# Patient Record
Sex: Female | Born: 1965 | Race: White | Hispanic: No | Marital: Married | Smoking: Current some day smoker
Health system: Southern US, Community
[De-identification: ages and names within clinical notes are randomized; demographics above are authoritative.]

## PROBLEM LIST (undated history)

## (undated) DIAGNOSIS — I10 Essential (primary) hypertension: Secondary | ICD-10-CM

## (undated) DIAGNOSIS — E7800 Pure hypercholesterolemia, unspecified: Secondary | ICD-10-CM

## (undated) DIAGNOSIS — E079 Disorder of thyroid, unspecified: Secondary | ICD-10-CM

## (undated) HISTORY — PX: CHOLECYSTECTOMY: SHX55

## (undated) HISTORY — PX: LAPAROSCOPIC GASTRIC BANDING: SHX1100

---

## 1998-09-02 ENCOUNTER — Inpatient Hospital Stay (HOSPITAL_COMMUNITY): Admission: AD | Admit: 1998-09-02 | Discharge: 1998-09-05 | Payer: Self-pay | Admitting: Gynecology

## 1998-09-05 ENCOUNTER — Encounter (HOSPITAL_COMMUNITY): Admission: RE | Admit: 1998-09-05 | Discharge: 1998-12-04 | Payer: Self-pay | Admitting: Gynecology

## 1998-10-01 ENCOUNTER — Emergency Department (HOSPITAL_COMMUNITY): Admission: EM | Admit: 1998-10-01 | Discharge: 1998-10-01 | Payer: Self-pay | Admitting: Emergency Medicine

## 1998-10-01 ENCOUNTER — Encounter: Payer: Self-pay | Admitting: Emergency Medicine

## 1998-12-06 ENCOUNTER — Inpatient Hospital Stay (HOSPITAL_COMMUNITY): Admission: EM | Admit: 1998-12-06 | Discharge: 1998-12-11 | Payer: Self-pay | Admitting: Emergency Medicine

## 1998-12-06 ENCOUNTER — Encounter: Payer: Self-pay | Admitting: Otolaryngology

## 1998-12-06 ENCOUNTER — Encounter: Payer: Self-pay | Admitting: Emergency Medicine

## 1998-12-07 ENCOUNTER — Encounter: Payer: Self-pay | Admitting: Surgery

## 2000-03-23 ENCOUNTER — Emergency Department (HOSPITAL_COMMUNITY): Admission: EM | Admit: 2000-03-23 | Discharge: 2000-03-23 | Payer: Self-pay | Admitting: Emergency Medicine

## 2000-03-23 ENCOUNTER — Encounter: Payer: Self-pay | Admitting: Emergency Medicine

## 2000-04-22 ENCOUNTER — Encounter: Payer: Self-pay | Admitting: Family Medicine

## 2000-04-22 ENCOUNTER — Encounter: Admission: RE | Admit: 2000-04-22 | Discharge: 2000-04-22 | Payer: Self-pay | Admitting: Family Medicine

## 2012-04-04 ENCOUNTER — Ambulatory Visit: Payer: Self-pay | Admitting: *Deleted

## 2015-09-10 ENCOUNTER — Other Ambulatory Visit: Payer: Self-pay | Admitting: *Deleted

## 2015-09-10 DIAGNOSIS — Z1231 Encounter for screening mammogram for malignant neoplasm of breast: Secondary | ICD-10-CM

## 2015-10-09 ENCOUNTER — Ambulatory Visit: Payer: Self-pay

## 2015-10-20 ENCOUNTER — Ambulatory Visit
Admission: RE | Admit: 2015-10-20 | Discharge: 2015-10-20 | Disposition: A | Discharge status: Home / Self Care | Payer: BLUE CROSS/BLUE SHIELD | Source: Ambulatory Visit | Attending: *Deleted | Admitting: *Deleted

## 2015-10-20 DIAGNOSIS — Z1231 Encounter for screening mammogram for malignant neoplasm of breast: Secondary | ICD-10-CM

## 2020-04-03 ENCOUNTER — Emergency Department (HOSPITAL_COMMUNITY)
Admission: EM | Admit: 2020-04-03 | Discharge: 2020-04-03 | Disposition: A | Discharge status: Home / Self Care | Payer: BLUE CROSS/BLUE SHIELD | Attending: Emergency Medicine | Admitting: Emergency Medicine

## 2020-04-03 ENCOUNTER — Other Ambulatory Visit: Payer: Self-pay

## 2020-04-03 ENCOUNTER — Emergency Department (HOSPITAL_COMMUNITY): Payer: BLUE CROSS/BLUE SHIELD

## 2020-04-03 ENCOUNTER — Encounter (HOSPITAL_COMMUNITY): Payer: Self-pay

## 2020-04-03 DIAGNOSIS — R110 Nausea: Secondary | ICD-10-CM | POA: Diagnosis not present

## 2020-04-03 DIAGNOSIS — Z79899 Other long term (current) drug therapy: Secondary | ICD-10-CM | POA: Diagnosis not present

## 2020-04-03 DIAGNOSIS — R1900 Intra-abdominal and pelvic swelling, mass and lump, unspecified site: Secondary | ICD-10-CM

## 2020-04-03 DIAGNOSIS — R1031 Right lower quadrant pain: Secondary | ICD-10-CM | POA: Diagnosis present

## 2020-04-03 DIAGNOSIS — F17210 Nicotine dependence, cigarettes, uncomplicated: Secondary | ICD-10-CM | POA: Insufficient documentation

## 2020-04-03 DIAGNOSIS — I10 Essential (primary) hypertension: Secondary | ICD-10-CM | POA: Insufficient documentation

## 2020-04-03 DIAGNOSIS — R1909 Other intra-abdominal and pelvic swelling, mass and lump: Secondary | ICD-10-CM | POA: Diagnosis not present

## 2020-04-03 HISTORY — DX: Disorder of thyroid, unspecified: E07.9

## 2020-04-03 HISTORY — DX: Pure hypercholesterolemia, unspecified: E78.00

## 2020-04-03 HISTORY — DX: Essential (primary) hypertension: I10

## 2020-04-03 LAB — COMPREHENSIVE METABOLIC PANEL
ALT: 17 U/L (ref 0–44)
AST: 20 U/L (ref 15–41)
Albumin: 4 g/dL (ref 3.5–5.0)
Alkaline Phosphatase: 54 U/L (ref 38–126)
Anion gap: 11 (ref 5–15)
BUN: 12 mg/dL (ref 6–20)
CO2: 28 mmol/L (ref 22–32)
Calcium: 8.8 mg/dL — ABNORMAL LOW (ref 8.9–10.3)
Chloride: 96 mmol/L — ABNORMAL LOW (ref 98–111)
Creatinine, Ser: 0.67 mg/dL (ref 0.44–1.00)
GFR, Estimated: >60 mL/min (ref >60)
Glucose, Bld: 123 mg/dL — ABNORMAL HIGH (ref 70–99)
Potassium: 3.7 mmol/L (ref 3.5–5.1)
Sodium: 135 mmol/L (ref 135–145)
Total Bilirubin: 1.4 mg/dL — ABNORMAL HIGH (ref 0.3–1.2)
Total Protein: 7.6 g/dL (ref 6.5–8.1)

## 2020-04-03 LAB — CBC
HCT: 42 % (ref 36.0–46.0)
Hemoglobin: 13.5 g/dL (ref 12.0–15.0)
MCH: 29.2 pg (ref 26.0–34.0)
MCHC: 32.1 g/dL (ref 30.0–36.0)
MCV: 90.9 fL (ref 80.0–100.0)
Platelets: 341 10*3/uL (ref 150–400)
RBC: 4.62 MIL/uL (ref 3.87–5.11)
RDW: 13.2 % (ref 11.5–15.5)
WBC: 13.8 10*3/uL — ABNORMAL HIGH (ref 4.0–10.5)
nRBC: 0 % (ref 0.0–0.2)

## 2020-04-03 LAB — URINALYSIS, ROUTINE W REFLEX MICROSCOPIC
Bilirubin Urine: NEGATIVE
Glucose, UA: NEGATIVE mg/dL
Ketones, ur: NEGATIVE mg/dL
Nitrite: NEGATIVE
Protein, ur: NEGATIVE mg/dL
Specific Gravity, Urine: 1.04 — ABNORMAL HIGH (ref 1.005–1.030)
pH: 5 (ref 5.0–8.0)

## 2020-04-03 LAB — I-STAT BETA HCG BLOOD, ED (MC, WL, AP ONLY): I-stat hCG, quantitative: 7.6 m[IU]/mL — ABNORMAL HIGH (ref <5)

## 2020-04-03 LAB — LIPASE, BLOOD: Lipase: 21 U/L (ref 11–51)

## 2020-04-03 MED ORDER — ONDANSETRON 4 MG PO TBDP: 4.0000 mg | ORAL_TABLET | Freq: Once | ORAL | Status: DC

## 2020-04-03 MED ORDER — IOHEXOL 300 MG/ML  SOLN
100.0000 mL | Freq: Once | INTRAMUSCULAR | Status: AC | PRN
  Administered 2020-04-03: 100 mL via INTRAVENOUS

## 2020-04-03 MED ORDER — MORPHINE SULFATE (PF) 4 MG/ML IV SOLN
4.0000 mg | Freq: Once | INTRAVENOUS | Status: AC
  Administered 2020-04-03: 4 mg via INTRAVENOUS
  Filled 2020-04-03: qty 1

## 2020-04-03 MED ORDER — SODIUM CHLORIDE 0.9 % IV BOLUS
1000.0000 mL | Freq: Once | INTRAVENOUS | Status: AC
Start: 2020-04-03 — End: 2020-04-03
  Administered 2020-04-03: 1000 mL via INTRAVENOUS

## 2020-04-03 MED ORDER — OXYCODONE-ACETAMINOPHEN 5-325 MG PO TABS: 1.0000 | ORAL_TABLET | Freq: Three times a day (TID) | ORAL | 0 refills | Status: AC | PRN

## 2020-04-03 MED ORDER — METOCLOPRAMIDE HCL 10 MG PO TABS
5.0000 mg | ORAL_TABLET | Freq: Once | ORAL | Status: AC
  Administered 2020-04-03: 5 mg via ORAL
  Filled 2020-04-03: qty 1

## 2020-04-03 MED ORDER — ONDANSETRON 4 MG PO TBDP: 4.0000 mg | ORAL_TABLET | Freq: Three times a day (TID) | ORAL | 0 refills | Status: AC | PRN

## 2020-04-03 MED ORDER — ONDANSETRON HCL 4 MG/2ML IJ SOLN
4.0000 mg | Freq: Once | INTRAMUSCULAR | Status: AC
  Administered 2020-04-03: 4 mg via INTRAVENOUS
  Filled 2020-04-03: qty 2

## 2020-04-03 MED ORDER — OXYCODONE-ACETAMINOPHEN 5-325 MG PO TABS
1.0000 | ORAL_TABLET | Freq: Once | ORAL | Status: AC
  Administered 2020-04-03: 1 via ORAL
  Filled 2020-04-03: qty 1

## 2020-04-03 NOTE — ED Provider Notes (Signed)
[facility]-EMERGENCY DEPT Provider Note   CSN: [id number] Arrival date & time: 04/03/20  1737     History Chief Complaint  Patient presents with  . Abdominal Pain  . Nausea    Ana Tanner is a 54 y.o. female with a past medical history of hypertension, hyperlipidemia presenting to the ED with a chief complaint of right lower quadrant abdominal pain and nausea.  Symptoms began approximately 1 week ago.  Have gradually progressed despite taking NSAIDs for pain.  Denies any vomiting, changes to bowel movements or urination.  No history of kidney stones but she is concerned that she could have 1.  Prior abdominal surgeries include cholecystectomy and C-section.  No sick contacts similar symptoms.  Denies any fevers, chest pain, shortness of breath, pelvic pain, vaginal discharge or abnormal vaginal bleeding.  HPI     Past Medical History:  Diagnosis Date  . High cholesterol   . Hypertension   . Thyroid disease     There are no problems to display for this patient.   Past Surgical History:  Procedure Laterality Date  . CESAREAN SECTION    . CHOLECYSTECTOMY    . LAPAROSCOPIC GASTRIC BANDING       OB History   No obstetric history on file.     Family History  Problem Relation Age of Onset  . Cancer Mother     Social History   Tobacco Use  . Smoking status: Current Some Day Smoker    Types: Cigarettes  . Smokeless tobacco: Never Used  Vaping Use  . Vaping Use: Never used  Substance Use Topics  . Alcohol use: Yes  . Drug use: Never    Home Medications Prior to Admission medications   Medication Sig Start Date End Date Taking? Authorizing Provider  amLODipine (NORVASC) 5 MG tablet Take 5 mg by mouth daily. 11/21/17  Yes [provider]  atorvastatin (LIPITOR) 20 MG tablet Take 20 mg by mouth daily. 07/25/19  Yes [provider]  Eszopiclone 3 MG TABS Take 3 mg by mouth at bedtime. 02/27/20  Yes [provider]   hydrochlorothiazide (HYDRODIURIL) 12.5 MG tablet Take 12.5 mg by mouth daily. 03/17/20  Yes [provider]  levothyroxine (SYNTHROID) 200 MCG tablet Take 200 mcg by mouth daily. 10/17/19  Yes [provider]  liraglutide (VICTOZA) 18 MG/3ML SOPN Inject 0.6 mg into the skin See admin instructions. Inject 0.6 mg once daily for one week. Increase by 0.6 mg daily at weekly intervals to a target dose of 3 mg once daily. 03/25/20  Yes [provider]  lisinopril (ZESTRIL) 30 MG tablet Take 30 mg by mouth daily. 10/24/19  Yes [provider]  ondansetron (ZOFRAN ODT) 4 MG disintegrating tablet Take 1 tablet (4 mg total) by mouth every 8 (eight) hours as needed for nausea or vomiting. 04/03/20   Nickalus Thornsberry, PA-C  oxyCODONE-acetaminophen (PERCOCET/ROXICET) 5-325 MG tablet Take 1 tablet by mouth every 8 (eight) hours as needed for severe pain. 04/03/20   Dietrich Pates, PA-C    Allergies    Patient has no known allergies.  Review of Systems   Review of Systems  Constitutional: Negative for appetite change, chills and fever.  HENT: Negative for ear pain, rhinorrhea, sneezing and sore throat.   Eyes: Negative for photophobia and visual disturbance.  Respiratory: Negative for cough, chest tightness, shortness of breath and wheezing.   Cardiovascular: Negative for chest pain and palpitations.  Gastrointestinal: Positive for abdominal pain and  nausea. Negative for blood in stool, constipation, diarrhea and vomiting.  Genitourinary: Negative for dysuria, hematuria and urgency.  Musculoskeletal: Negative for myalgias.  Skin: Negative for rash.  Neurological: Negative for dizziness, weakness and light-headedness.    Physical Exam Updated Vital Signs BP 110/82   Pulse 95   Temp 98.3 F (36.8 C) (Oral)   Resp 16   Ht 5\' 8"  (1.727 m)   Wt (!) 190.5 kg   LMP 03/20/2020 Comment: Spotting  SpO2 96%   BMI 63.86 kg/m   Physical Exam Vitals and nursing note reviewed.   Constitutional:      General: She is not in acute distress.    Appearance: She is well-developed and well-nourished. She is obese.  HENT:     Head: Normocephalic and atraumatic.     Nose: Nose normal.  Eyes:     General: No scleral icterus.       Left eye: No discharge.     Extraocular Movements: EOM normal.     Conjunctiva/sclera: Conjunctivae normal.  Cardiovascular:     Rate and Rhythm: Normal rate and regular rhythm.     Pulses: Intact distal pulses.     Heart sounds: Normal heart sounds. No murmur heard. No friction rub. No gallop.   Pulmonary:     Effort: Pulmonary effort is normal. No respiratory distress.     Breath sounds: Normal breath sounds.  Abdominal:     General: Bowel sounds are normal. There is no distension.     Palpations: Abdomen is soft.     Tenderness: There is abdominal tenderness in the right lower quadrant. There is no guarding.  Musculoskeletal:        General: No edema. Normal range of motion.     Cervical back: Normal range of motion and neck supple.  Skin:    General: Skin is warm and dry.     Findings: No rash.  Neurological:     Mental Status: She is alert.     Motor: No abnormal muscle tone.     Coordination: Coordination normal.  Psychiatric:        Mood and Affect: Mood and affect normal.     ED Results / Procedures / Treatments   Labs (all labs ordered are listed, but only abnormal results are displayed) Labs Reviewed  COMPREHENSIVE METABOLIC PANEL - Abnormal; Notable for the following components:      Result Value   Chloride 96 (*)    Glucose, Bld 123 (*)    Calcium 8.8 (*)    Total Bilirubin 1.4 (*)    All other components within normal limits  CBC - Abnormal; Notable for the following components:   WBC 13.8 (*)    All other components within normal limits  URINALYSIS, ROUTINE W REFLEX MICROSCOPIC - Abnormal; Notable for the following components:   APPearance HAZY (*)    Specific Gravity, Urine 1.040 (*)    Hgb urine  dipstick MODERATE (*)    Leukocytes,Ua TRACE (*)    Bacteria, UA RARE (*)    All other components within normal limits  I-STAT BETA HCG BLOOD, ED ([facility], [facility], [facility] ONLY) - Abnormal; Notable for the following components:   I-stat hCG, quantitative 7.6 (*)    All other components within normal limits  URINE CULTURE  LIPASE, BLOOD    EKG None  Radiology CT ABDOMEN PELVIS W CONTRAST  Result Date: 04/03/2020 CLINICAL DATA:  54 year old female with right lower quadrant abdominal pain and nausea for 1 week.  EXAM: CT ABDOMEN AND PELVIS WITH CONTRAST TECHNIQUE: Multidetector CT imaging of the abdomen and pelvis was performed using the standard protocol following bolus administration of intravenous contrast. CONTRAST:  OMNIPAQUE IOHEXOL 300 MG/ML  SOLN COMPARISON:  None available. FINDINGS: Lower chest: Cardiac size at the upper limits of normal. Negative lung bases. No pericardial or pleural effusion. Hepatobiliary: Absent gallbladder. Two low-density areas in the liver, the larger is circumscribed and with simple fluid density measuring about 3 cm diameter compatible with benign cyst. The smaller is too small to characterize. Pancreas: Negative. Spleen: Negative. Adrenals/Urinary Tract: Normal adrenal glands. Symmetric enhancement of the kidneys and contrast excretion on delayed images. Occasional tiny low-density renal areas likely benign cysts. No nephrolithiasis or hydronephrosis. Proximal ureters are decompressed. Sequelae of gastric banding Stomach/Bowel: . No adverse features. Decompressed stomach. Duodenum is within normal limits. No dilated small bowel. No dilated large bowel. Retained stool in the colon. Decompressed rectosigmoid colon. Indistinct, somewhat inflamed appearance of distal small bowel mesentery in the lower abdomen abutting abnormal appearing uterus (series 2, image 57), see below. Abnormal right ovary also appears inseparable from the cecum and possibly the appendix. No superimposed  pneumoperitoneum. Vascular/Lymphatic: Suboptimal intravascular contrast bolus but the major arterial structures in the abdomen and pelvis appear patent with no atherosclerosis identified. No lymphadenopathy identified. Reproductive: The left ovary is difficult to delineate. The uterus is abnormally enlarged, lobulated with indistinct margins, with mild to moderate inflammatory stranding where the fundus meets the lower bowel mesentery (series 2, image 57). The uterus encompasses 14 by 14.2 x 12.1 cm (AP by transverse by CC). Furthermore globular and indistinct enlargement of the right ovary is suspected near the right uterine fundus in the lower quadrant (series 2, image 65), measuring up to 8.8 cm diameter. This abnormal ovary is inseparable from the tip of the cecum, and there is trace regional right gutter free fluid. However, the tip of the cecum does not appear inflamed, and there is subtle evidence of a normal appendix (containing gas, nondilated) inseparable from the medial margin of the abnormal ovary (series 2, image 62 and coronal image 59). Other: No pelvic free fluid. Musculoskeletal: Spine degeneration. No acute osseous abnormality identified. IMPRESSION: 1. Abnormally enlarged, lobulated, and indistinct uterus and right ovary. The uterus is up to 14.2 cm diameter, the ovary up to 8.8 cm. Left ovary cannot be identified. Mild inflammatory changes where the fundus of the abnormal uterus meets distal small bowel mesentery. Trace free fluid in the lower right gutter. The abnormal right ovary is also inseparable from the cecum - and probably also the appendix which seems to remain normal. The appearance is suspicious for GYN malignancy. Degenerating large uterine fibroid was also considered, but would not explain the enlarged right ovary. Pelvis MRI (GYN protocol without and with contrast) would be most valuable to attempt further imaging characterization. 2. No other acute finding identified in the abdomen  or pelvis. Prior gastric banding.  Benign appearing hepatic and renal cysts. Electronically Signed   By: Odessa Fleming M.D.   On: 04/03/2020 19:53    Procedures Procedures (including critical care time)  Medications Ordered in ED Medications  sodium chloride 0.9 % bolus 1,000 mL (1,000 mLs Intravenous New Bag/Given 04/03/20 1843)  morphine 4 MG/ML injection 4 mg (4 mg Intravenous Given 04/03/20 1844)  ondansetron (ZOFRAN) injection 4 mg (4 mg Intravenous Given 04/03/20 1844)  iohexol (OMNIPAQUE) 300 MG/ML solution 100 mL (100 mLs Intravenous Contrast Given 04/03/20 1926)    ED  Course  I have reviewed the triage vital signs and the nursing notes.  Pertinent labs & imaging results that were available during my care of the patient were reviewed by me and considered in my medical decision making (see chart for details).  Clinical Course as of 04/03/20 2059  Thu Apr 03, 2020  1819 WBC(!): 13.8 [HK]    Clinical Course User Index [HK] Dietrich PatesKhatri, Brayley Mackowiak, PA-C   MDM Rules/Calculators/A&P                          54 year old female with past medical history of hypertension, hyperlipidemia presenting to the ED with a chief complaint of right lower quadrant abdominal pain and nausea.  Symptoms began approximately 1 week ago.  They have gradually progressed despite taking NSAIDs.  Denies any vomiting, changes to bowel movements or urination.  On exam she has some tenderness of the right lower quadrant without rebound or guarding.  She denies any vaginal discharge, pelvic pain or abnormal vaginal bleeding.  She is currently perimenopausal.  Work appears significant for leukocytosis of 13.8 with eyes feel is reactive from her symptoms.  CMP is unremarkable.  Urinalysis with trace leukocytes and rare bacteria but otherwise unremarkable.  Lipase is unremarkable.  CT abdomen and pelvis shows enlarged uterus, right ovary.  This is suspicious for a GYN malignancy.  She denies family history of this but has not seen an  OB/GYN in several years.  This can definitely explain the pain that she is experiencing.  She does not currently have an OB/GYN so I will refer her to our women's clinic.  In the meantime we will treat symptomatically with pain medication and antiemetics.  The appendix appears normal there is no evidence of kidney stone on exam.  Patient is agreeable to following up.  Return precautions given.   Patient is hemodynamically stable, in NAD, and able to ambulate in the ED. Evaluation does not show pathology that would require ongoing emergent intervention or inpatient treatment. I explained the diagnosis to the patient. Pain has been managed and has no complaints prior to discharge. Patient is comfortable with above plan and is stable for discharge at this time. All questions were answered prior to disposition. Strict return precautions for returning to the ED were discussed. Encouraged follow up with PCP.   Prior to providing a prescription for a controlled substance, I independently reviewed the patient's recent prescription history on the [place] Controlled Substance Reporting System. The patient had no recent or regular prescriptions and was deemed appropriate for a brief, less than 3 day prescription of narcotic for acute analgesia.  An After Visit Summary was printed and given to the patient.   Portions of this note were generated with Scientist, clinical (histocompatibility and immunogenetics)Dragon dictation software. Dictation errors may occur despite best attempts at proofreading.  Final Clinical Impression(s) / ED Diagnoses Final diagnoses:  Pelvic mass in female    Rx / DC Orders ED Discharge Orders         Ordered    oxyCODONE-acetaminophen (PERCOCET/ROXICET) 5-325 MG tablet  Every 8 hours PRN        04/03/20 2056    ondansetron (ZOFRAN ODT) 4 MG disintegrating tablet  Every 8 hours PRN        04/03/20 2056           Dietrich PatesKhatri, Bailey Kolbe, PA-C 04/03/20 2059    Gerhard MunchLockwood, Robert, MD 04/03/20 2336

## 2020-04-03 NOTE — ED Triage Notes (Signed)
Patient c/o RLQ abdominal pain and nausea x 1  Week ago.

## 2020-04-03 NOTE — Discharge Instructions (Signed)
It is important for you to establish care with the OB/GYN clinic listed below due to your findings of enlarged uterus and right ovary on today's exam. In the meantime take the medications as needed to help with your symptoms. Return to the ER if you start to experience worsening pain, fever or abnormal vaginal bleeding, shortness of breath.

## 2020-04-06 LAB — URINE CULTURE: Culture: 70000 — AB

## 2020-04-07 ENCOUNTER — Telehealth: Payer: Self-pay | Admitting: Emergency Medicine

## 2020-04-07 NOTE — Progress Notes (Signed)
ED Antimicrobial Stewardship Positive Culture Follow Up   Ana Tanner is an 53 y.o. female who presented to [facility] on 04/03/2020 with a chief complaint of  Chief Complaint  Patient presents with  . Abdominal Pain  . Nausea    Recent Results (from the past 720 hour(s))  Urine culture     Status: Abnormal   Collection Time: 04/03/20  7:48 PM   Specimen: Urine, Random  Result Value Ref Range Status   Specimen Description   Final    URINE, RANDOM Performed at [facility], [address] [place], [place] [address]    Special Requests   Final    NONE Performed at [facility], [address] [place], [place] [address]    Culture 70,000 COLONIES/mL PROTEUS MIRABILIS (A)  Final   Report Status 04/06/2020 FINAL  Final   Organism ID, Bacteria PROTEUS MIRABILIS (A)  Final      Susceptibility   Proteus mirabilis - MIC*    AMPICILLIN <=2 SENSITIVE Sensitive     CEFAZOLIN <=4 SENSITIVE Sensitive     CEFEPIME <=0.12 SENSITIVE Sensitive     CEFTRIAXONE <=0.25 SENSITIVE Sensitive     CIPROFLOXACIN <=0.25 SENSITIVE Sensitive     GENTAMICIN <=1 SENSITIVE Sensitive     IMIPENEM 0.5 SENSITIVE Sensitive     NITROFURANTOIN >=512 RESISTANT Resistant     TRIMETH/SULFA <=20 SENSITIVE Sensitive     AMPICILLIN/SULBACTAM <=2 SENSITIVE Sensitive     PIP/TAZO <=4 SENSITIVE Sensitive     * 70,000 COLONIES/mL PROTEUS MIRABILIS   No treatment indicated  ED Provider: Dietrich Pates, PA-C  Herby Abraham, Pharm.D 04/07/2020 10:24 AM Clinical Pharmacist [phone]

## 2020-04-07 NOTE — Telephone Encounter (Signed)
Post ED Visit - Positive Culture Follow-up  Culture report reviewed by antimicrobial stewardship pharmacist: [facility] []  , Pharm.D. []  Enzo Bi, Pharm.D., BCPS AQ-ID []  , Pharm.D., BCPS []  Celedonio Miyamoto, Pharm.D., BCPS []  [place], Garvin Fila.D., BCPS, AAHIVP []  , Pharm.D., BCPS, AAHIVP []  Georgina Pillion, PharmD, BCPS []  , PharmD, BCPS []  [place], PharmD, BCPS []  [address], PharmD []  , PharmD, BCPS []  Estella Husk, PharmD  Pharmacy Team [x]  Lysle Pearl, PharmD []  , PharmD []  Phillips Climes, PharmD []  , Rph []  Agapito Games) , PharmD []  Verlan Friends, PharmD []  , PharmD []  Mervyn Gay, PharmD []  , PharmD []  Vinnie Level, PharmD []  [facility], PharmD []  , PharmD []  Len Childs, PharmD   Positive urine culture Treated with none, organism sensitive to the same and no further patient follow-up is required at this time.  04/07/2020, 11:24 AM

## 2022-02-20 IMAGING — CT CT ABD-PELV W/ CM
2 of 5 series · 15 of 46 positions shown, 17 images · IV contrast (omnipaque)
Comparison: None available.

CLINICAL DATA: 54-year-old female with right lower quadrant
abdominal pain and nausea for 1 week.

EXAM:
CT ABDOMEN AND PELVIS WITH CONTRAST
TECHNIQUE: Multidetector CT imaging of the abdomen and pelvis was performed
using the standard protocol following bolus administration of
intravenous contrast.
CONTRAST:  100mL OMNIPAQUE IOHEXOL 300 MG/ML  SOLN

[Series 2: axial st · axial · 0.98mm/px · z∈[-565,-145]mm · 12 of 99 slices shown, 14 images]
[im 8/99  soft-tissue]
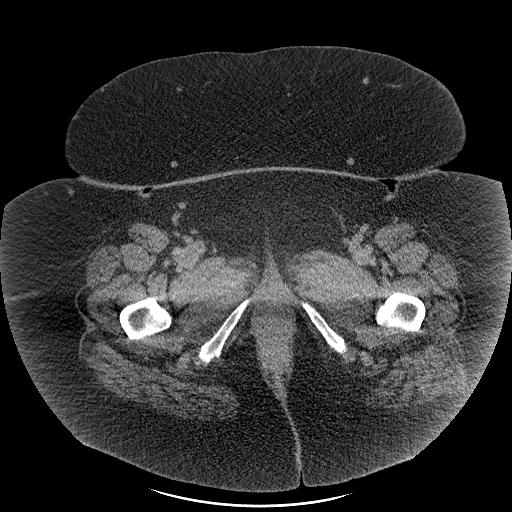
[im 8/99  bone]
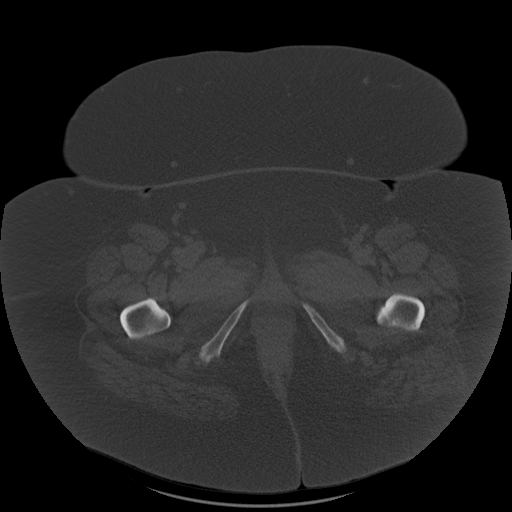
[im 15/99  soft-tissue]
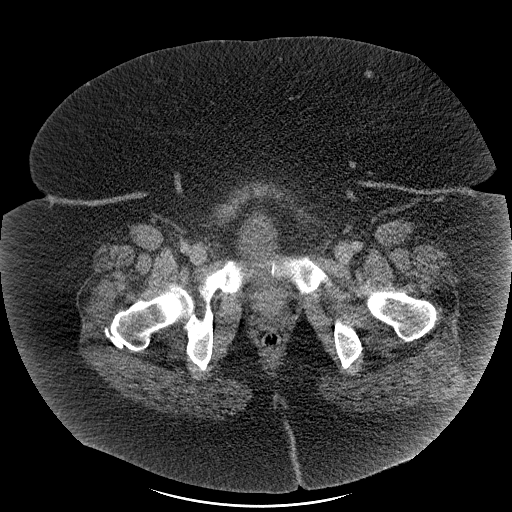
[im 22/99  soft-tissue]
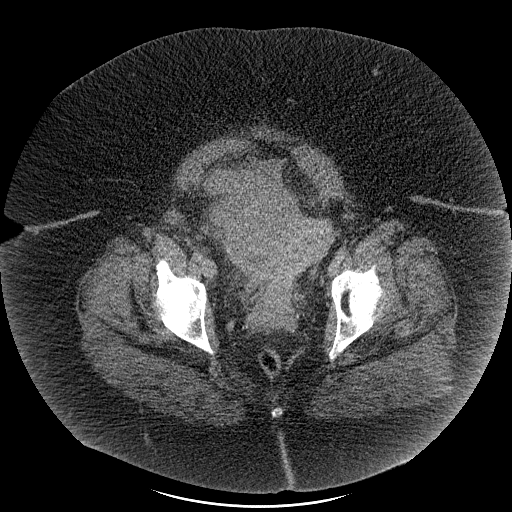
[im 29/99  soft-tissue]
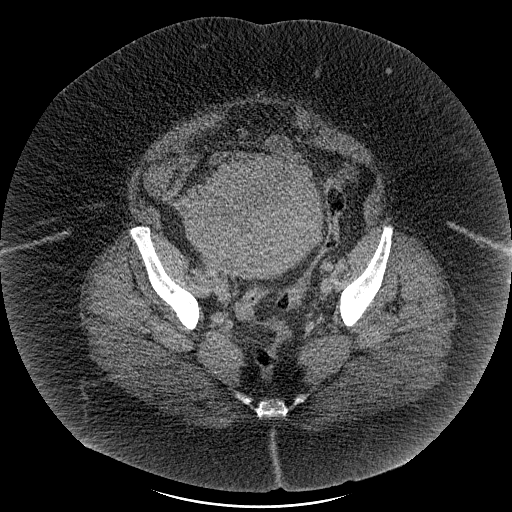
[im 36/99  soft-tissue]
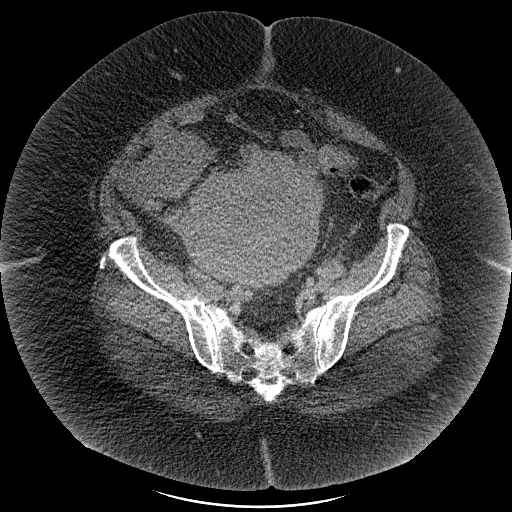
[im 43/99  soft-tissue]
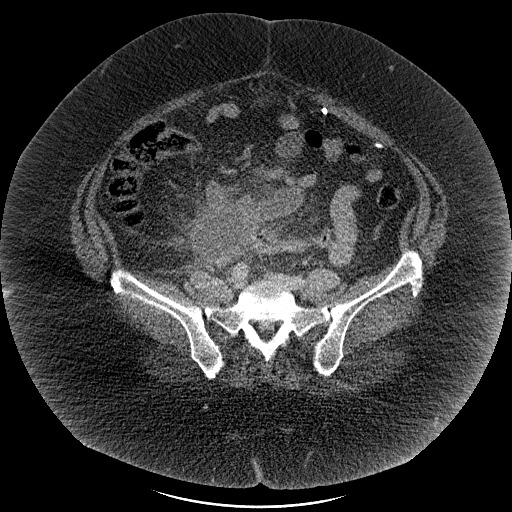
[im 57/99  soft-tissue]
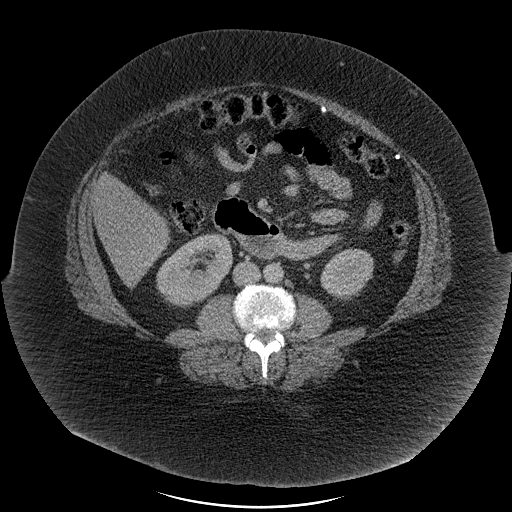
[im 64/99  soft-tissue]
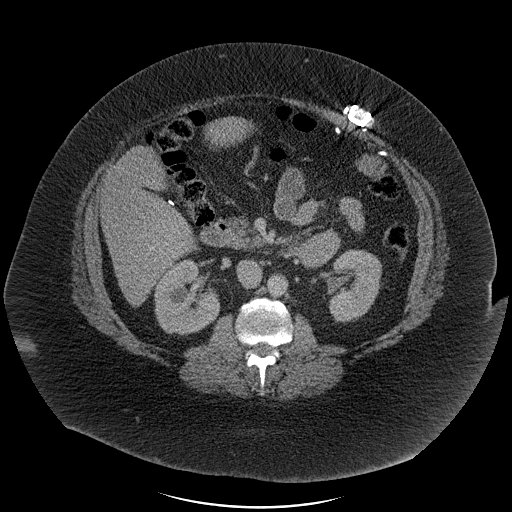
[im 71/99  soft-tissue]
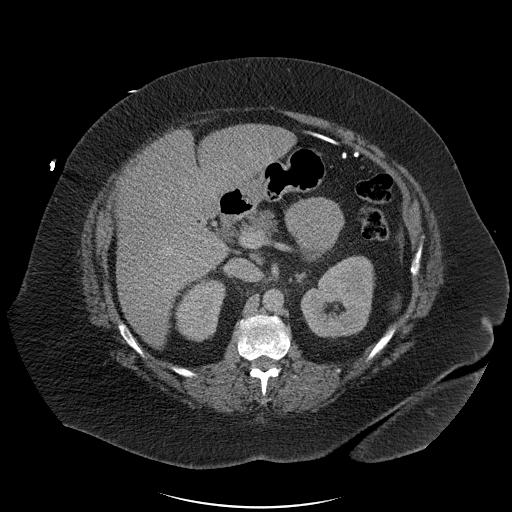
[im 71/99  bone]
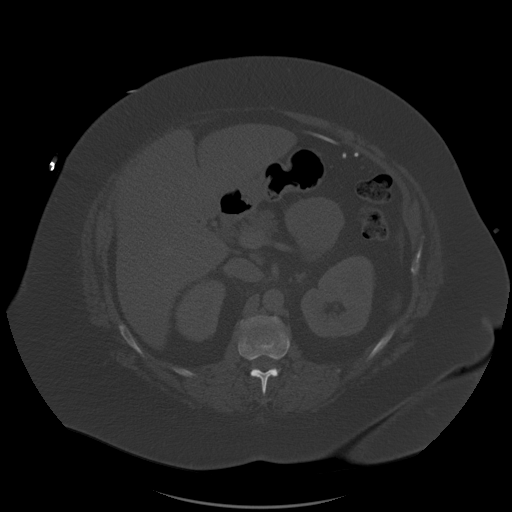
[im 78/99  soft-tissue]
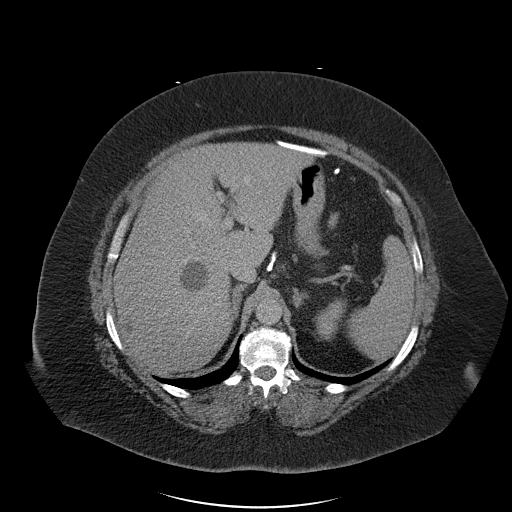
[im 85/99  soft-tissue]
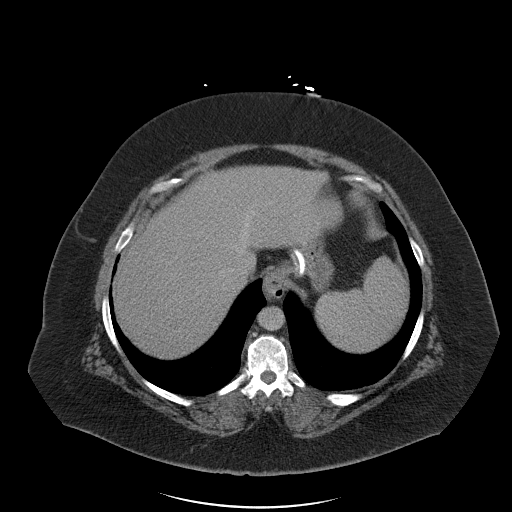
[im 92/99  soft-tissue]
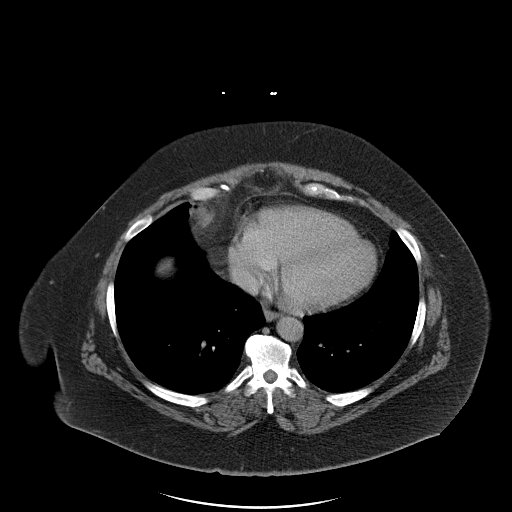

[Series 5: coronal st · coronal · 0.94mm/px · 3 of 225 slices shown]
[im 75/225  soft-tissue]
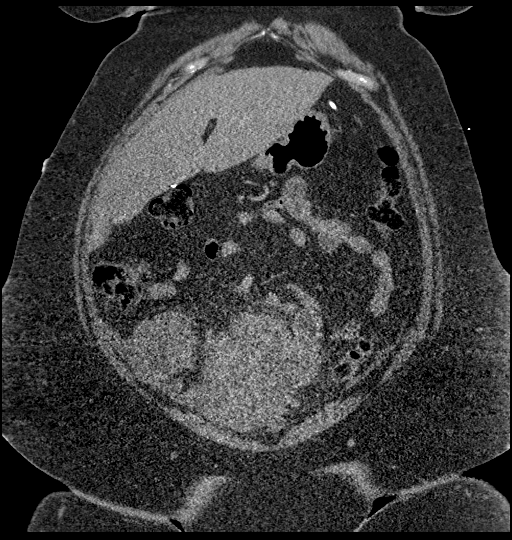
[im 100/225  soft-tissue]
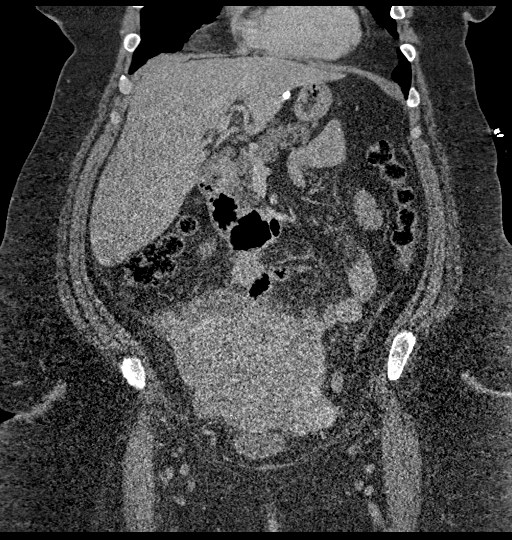
[im 125/225  soft-tissue]
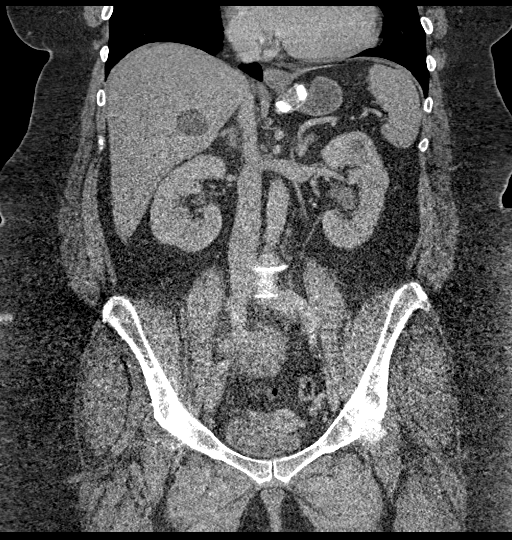

[15 of 46 positions shown; findings below may reference images not displayed]

FINDINGS: Lower chest: Cardiac size at the upper limits of normal. Negative
lung bases. No pericardial or pleural effusion.

Hepatobiliary: Absent gallbladder. Two low-density areas in the
liver, the larger is circumscribed and with simple fluid density
measuring about 3 cm diameter compatible with benign cyst. The
smaller is too small to characterize.

Pancreas: Negative.

Spleen: Negative.

Adrenals/Urinary Tract: Normal adrenal glands. Symmetric enhancement
of the kidneys and contrast excretion on delayed images. Occasional
tiny low-density renal areas likely benign cysts. No nephrolithiasis
or hydronephrosis. Proximal ureters are decompressed.

Sequelae of gastric banding

Stomach/Bowel: . No adverse features. Decompressed stomach. Duodenum
is within normal limits. No dilated small bowel. No dilated large
bowel. Retained stool in the colon. Decompressed rectosigmoid colon.

Indistinct, somewhat inflamed appearance of distal small bowel
mesentery in the lower abdomen abutting abnormal appearing uterus
(series 2, image 57), see below. Abnormal right ovary also appears
inseparable from the cecum and possibly the appendix.

No superimposed pneumoperitoneum.

Vascular/Lymphatic: Suboptimal intravascular contrast bolus but the
major arterial structures in the abdomen and pelvis appear patent
with no atherosclerosis identified.

No lymphadenopathy identified.

Reproductive: The left ovary is difficult to delineate.

The uterus is abnormally enlarged, lobulated with indistinct
margins, with mild to moderate inflammatory stranding where the
fundus meets the lower bowel mesentery (series 2, image 57). The
uterus encompasses 14 by 14.2 x 12.1 cm (AP by transverse by CC).

Furthermore globular and indistinct enlargement of the right ovary
is suspected near the right uterine fundus in the lower quadrant
(series 2, image 65), measuring up to 8.8 cm diameter. This abnormal
ovary is inseparable from the tip of the cecum, and there is trace
regional right gutter free fluid. However, the tip of the cecum does
not appear inflamed, and there is subtle evidence of a normal
appendix (containing gas, nondilated) inseparable from the medial
margin of the abnormal ovary (series 2, image 62 and coronal image
59).

Other: No pelvic free fluid.

Musculoskeletal: Spine degeneration. No acute osseous abnormality
identified.
IMPRESSION: 1. Abnormally enlarged, lobulated, and indistinct uterus and right
ovary. The uterus is up to 14.2 cm diameter, the ovary up to 8.8 cm.
Left ovary cannot be identified.
Mild inflammatory changes where the fundus of the abnormal uterus
meets distal small bowel mesentery. Trace free fluid in the lower
right gutter.
The abnormal right ovary is also inseparable from the cecum - and
probably also the appendix which seems to remain normal.
The appearance is suspicious for GYN malignancy.
Degenerating large uterine fibroid was also considered, but would
not explain the enlarged right ovary.
Pelvis MRI (GYN protocol without and with contrast) would be most
valuable to attempt further imaging characterization.

2. No other acute finding identified in the abdomen or pelvis.
Prior gastric banding.  Benign appearing hepatic and renal cysts.
